# Patient Record
Sex: Male | Born: 1980 | Race: Black or African American | Hispanic: No | Marital: Single | Smoking: Never smoker
Health system: Southern US, Community
[De-identification: ages and names within clinical notes are randomized; demographics above are authoritative.]

---

## 2015-01-23 ENCOUNTER — Ambulatory Visit (INDEPENDENT_AMBULATORY_CARE_PROVIDER_SITE_OTHER): Payer: BLUE CROSS/BLUE SHIELD | Admitting: Family Medicine

## 2015-01-23 ENCOUNTER — Ambulatory Visit (INDEPENDENT_AMBULATORY_CARE_PROVIDER_SITE_OTHER): Payer: BLUE CROSS/BLUE SHIELD

## 2015-01-23 VITALS — BP 124/86 | HR 70 | Temp 98.5°F | Resp 14 | Ht 68.5 in | Wt 178.0 lb

## 2015-01-23 DIAGNOSIS — M79644 Pain in right finger(s): Secondary | ICD-10-CM | POA: Diagnosis not present

## 2015-01-23 DIAGNOSIS — M2550 Pain in unspecified joint: Secondary | ICD-10-CM

## 2015-01-23 LAB — URIC ACID: URIC ACID, SERUM: 7.8 mg/dL (ref 4.0–7.8)

## 2015-01-23 MED ORDER — NAPROXEN 500 MG PO TABS: 500.0000 mg | ORAL_TABLET | Freq: Two times a day (BID) | ORAL | Status: DC

## 2015-01-23 NOTE — Progress Notes (Addendum)
Subjective:  This chart was scribed for Alexander Staggers, MD by [facility], medical scribe at [facility].The patient was seen in exam room 01 and the patient's care was started at 4:25 PM.   Patient ID: Alexander Brooks, male    DOB: 1981/01/12, 34 y.o.   MRN: [id number] Chief Complaint  Patient presents with  . Hand Pain    right hand, 5th digit. x 1 year, describes pain as "shooting pain"   HPI HPI Comments: Alexander Brooks is a 34 y.o. male who presents to [facility] complaining of right a sharp pain at his fifth digit on his right hand. Pain has been constant, sharp and gradually worsening for roughly one year. The pain is mainly at night and deep breathing worsens the pain. No known injury and no swelling. Pt is left handed. 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 He has not taken any medication for this. Other joints have been sore lasting for only a few days then improving. He does not drink, but does eat seafood and red meat. Pt states he has noticed joints are more sore when he eats red meat.  Pt also complains of right hip pain onset one day ago, last week he had left hip pain but this has improved.   There are no active problems to display for this patient.  History reviewed. No pertinent past medical history. History reviewed. No pertinent past surgical history. Not on File Prior to Admission medications   Not on File   History   Social History  . Marital Status: Single    Spouse Name: N/A  . Number of Children: N/A  . Years of Education: N/A   Occupational History  . Not on file.   Social History Main Topics  . Smoking status: Never Smoker   . Smokeless tobacco: Not on file  . Alcohol Use: Not on file  . Drug Use: Not on file  . Sexual Activity: Not on file   Other Topics Concern  . Not on file   Social History Narrative  . No narrative on file   Review of Systems  Musculoskeletal: Positive  for arthralgias. Negative for joint swelling.      Objective:  BP 124/86 mmHg  Pulse 70  Temp(Src) 98.5 F (36.9 C) (Oral)  Resp 14  Ht 5' 8.5" (1.74 m)  Wt 178 lb (80.74 kg)  BMI 26.67 kg/m2  SpO2 96% Physical Exam  Constitutional: He is oriented to person, place, and time. He appears well-developed and well-nourished. No distress.  HENT:  Head: Normocephalic and atraumatic.  Eyes: Pupils are equal, round, and reactive to light.  Neck: Normal range of motion.  C-spine full ROM does not reproduce pain No Tinel's sign over right brachial plexus  Cardiovascular: Normal rate and regular rhythm.   Pulmonary/Chest: Effort normal. No respiratory distress.  Musculoskeletal: Normal range of motion.  Right shoulder and right elbow full range of motion and no reproduction of pain.  Negative Tinel's sign of the right elbow, specifically at the ulnar nerve. Right wrist full range of motion, no bony tenderness. Negative Tinel's sign at the guyon's canal. Negative Phalen's sign and Tinel's sign for carpal tunnel Right 5th finger full range of motion , skin  in tact no soft tissue or joint swelling Tender over distal phalanx just proximal to the nail of the 5th finger but he nail fold is without erythema or rash.  Neurological: He is alert and oriented to person, place, and time.  Skin: Skin is warm and dry.  Psychiatric: He has a normal mood and affect. His behavior is normal.  Nursing note and vitals reviewed.  [facility] reading (PRIMARY) by  Dr. Neva SeatGreene: R 5th finger: questionable nondisplaced fracture of base of distal 5th phalynx.      Assessment & Plan:   Sheilah PigeonClarence Fregeau is a 34 y.o. male Polyarthralgia - Plan: Uric Acid, DG Finger Little Right, naproxen (NAPROSYN) 500 MG tablet  Finger pain, right - Plan: Uric Acid, DG Finger Little Right, naproxen (NAPROSYN) 500 MG tablet   Presents primarily with right fifth finger pain without known injury. Questionable nondisplaced fracture on  x-ray. However based on his previous symptoms of polyarthralgia in various joints that resolve on their own, as well as association of these flares with diet indicates possible gout cause of his symptoms.   -Check uric acid level  -Split placed for fifth finger to just proximal to the DIP  -Naprosyn 500 mg twice a day when necessary with food, side effects discussed.   -Recheck in 2 weeks.  If polyarthralgias persist consider other rheumatologic testing   Meds ordered this encounter  Medications  . naproxen (NAPROSYN) 500 MG tablet    Sig: Take 1 tablet (500 mg total) by mouth 2 (two) times daily with a meal.    Dispense:  30 tablet    Refill:  0   Patient Instructions  There may be a small fracture at the end of your finger. Wear the splint and recheck with Dr. Neva SeatGreene in next 2 weeks (next weekend is ok).  Naproxen if needed twice per day with food (do not combine with other over the counter pain relievers).  You should receive a call or letter about your lab results (gout test) within the next week to 10 days. Return to the clinic or go to the nearest emergency room if any of your symptoms worsen or new symptoms occur.          I personally performed the services described in this documentation, which was scribed in my presence. The recorded information has been reviewed and considered, and addended by me as needed.

## 2015-01-23 NOTE — Patient Instructions (Signed)
There may be a small fracture at the end of your finger. Wear the splint and recheck with Dr. Neva SeatGreene in next 2 weeks (next weekend is ok).  Naproxen if needed twice per day with food (do not combine with other over the counter pain relievers).  You should receive a call or letter about your lab results (gout test) within the next week to 10 days. Return to the clinic or go to the nearest emergency room if any of your symptoms worsen or new symptoms occur.

## 2015-02-02 ENCOUNTER — Encounter: Payer: Self-pay | Admitting: Family Medicine

## 2015-02-12 ENCOUNTER — Ambulatory Visit (INDEPENDENT_AMBULATORY_CARE_PROVIDER_SITE_OTHER): Payer: BLUE CROSS/BLUE SHIELD | Admitting: Emergency Medicine

## 2015-02-12 VITALS — BP 120/80 | HR 81 | Temp 98.8°F | Resp 16 | Ht 69.0 in | Wt 179.0 lb

## 2015-02-12 DIAGNOSIS — M79644 Pain in right finger(s): Secondary | ICD-10-CM | POA: Diagnosis not present

## 2015-02-12 MED ORDER — METHYLPREDNISOLONE ACETATE 80 MG/ML IJ SUSP: 80.0000 mg | Freq: Once | INTRAMUSCULAR | Status: AC

## 2015-02-12 NOTE — Patient Instructions (Signed)

## 2015-02-12 NOTE — Progress Notes (Signed)
Subjective:  Patient ID: Alexander Brooks, male    DOB: 07-01-1981  Age: 34 y.o. MRN: [id number]  CC: Follow-up and Hand Pain   HPI Alexander Brooks presents  Pain of the terminal phalanx of his right fifth finger. He said that he was seen by Dr. Chilton Si and put on naproxen for DIP joint pain. He's had the pain for a protracted period of time has no history of injury. No swelling or redness or deformity of the joint. His lab work when Dr. Chilton Si performed it was normal.  History Alexander Brooks has no past medical history on file.   He has no past surgical history on file.   His  family history includes Diabetes in his father, mother, and sister.  He   reports that he has never smoked. He does not have any smokeless tobacco history on file. His alcohol and drug histories are not on file.  Outpatient Prescriptions Prior to Visit  Medication Sig Dispense Refill  . naproxen (NAPROSYN) 500 MG tablet Take 1 tablet (500 mg total) by mouth 2 (two) times daily with a meal. (Patient not taking: Reported on 02/12/2015) 30 tablet 0   No facility-administered medications prior to visit.    History   Social History  . Marital Status: Single    Spouse Name: N/A  . Number of Children: N/A  . Years of Education: N/A   Social History Main Topics  . Smoking status: Never Smoker   . Smokeless tobacco: Not on file  . Alcohol Use: Not on file  . Drug Use: Not on file  . Sexual Activity: Not on file   Other Topics Concern  . None   Social History Narrative     Review of Systems  Constitutional: Negative for fever, chills and appetite change.  HENT: Negative for congestion, ear pain, postnasal drip, sinus pressure and sore throat.   Eyes: Negative for pain and redness.  Respiratory: Negative for cough, shortness of breath and wheezing.   Cardiovascular: Negative for leg swelling.  Gastrointestinal: Negative for nausea, vomiting, abdominal pain, diarrhea, constipation and blood in stool.    Endocrine: Negative for polyuria.  Genitourinary: Negative for dysuria, urgency, frequency and flank pain.  Musculoskeletal: Positive for arthralgias. Negative for gait problem.  Skin: Negative for rash.  Neurological: Negative for weakness and headaches.  Psychiatric/Behavioral: Negative for confusion and decreased concentration. The patient is not nervous/anxious.     Objective:  BP 120/80 mmHg  Pulse 81  Temp(Src) 98.8 F (37.1 C) (Oral)  Resp 16  Ht 5\' 9"  (1.753 m)  Wt 179 lb (81.194 kg)  BMI 26.42 kg/m2  SpO2 98%  Physical Exam  Constitutional: He is oriented to person, place, and time. He appears well-developed and well-nourished.  HENT:  Head: Normocephalic and atraumatic.  Eyes: Conjunctivae are normal. Pupils are equal, round, and reactive to light.  Pulmonary/Chest: Effort normal.  Musculoskeletal: He exhibits no edema.  Neurological: He is alert and oriented to person, place, and time.  Skin: Skin is dry.  Psychiatric: He has a normal mood and affect. His behavior is normal. Thought content normal.      Assessment & Plan:   Alexander Brooks was seen today for follow-up and hand pain.  Diagnoses and all orders for this visit:  Finger pain, right   I am having Alexander Brooks maintain his naproxen.  No orders of the defined types were placed in this encounter.   He was instructed to continue taking his medication as prescribed  0.15  ml of Medrol was injected into the DIP joint  with relief of his pain. Appropriate red flag conditions were discussed with the patient as well as actions that should be taken.  Patient expressed his understanding.  Follow-up: Return if symptoms worsen or fail to improve.  Carmelina Dane, MD

## 2015-04-25 ENCOUNTER — Ambulatory Visit (INDEPENDENT_AMBULATORY_CARE_PROVIDER_SITE_OTHER): Payer: Self-pay | Admitting: Family Medicine

## 2015-04-25 VITALS — BP 116/71 | HR 90 | Temp 98.3°F | Resp 18 | Ht 68.0 in | Wt 178.4 lb

## 2015-04-25 DIAGNOSIS — Z0000 Encounter for general adult medical examination without abnormal findings: Secondary | ICD-10-CM

## 2015-04-25 NOTE — Progress Notes (Signed)
[facility] [address], [place] [address] [phone]  Date:  04/25/2015   Name:  Alexander Brooks   DOB:  November 01, 1980   MRN:  [id number]  PCP:  No primary care provider on file.    Chief Complaint: Annual Exam   History of Present Illness:  Alexander Brooks is a 34 y.o. 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   History reviewed. No pertinent past medical history.  History reviewed. No pertinent past surgical history.  Social History  Substance Use Topics  . Smoking status: Never Smoker   . Smokeless tobacco: None  . Alcohol Use: No    Family History  Problem Relation Age of Onset  . Diabetes Mother   . Diabetes Father   . Diabetes Sister     No Known Allergies  Medication list has been reviewed and updated.  No current outpatient prescriptions on file prior to visit.   Current Facility-Administered Medications on File Prior to Visit  Medication Dose Route Frequency Provider Last Rate Last Dose  . methylPREDNISolone acetate (DEPO-MEDROL) injection 80 mg  80 mg Intramuscular Once Carmelina Dane, MD        Review of Systems:  As per HPI- otherwise negative.   Physical Examination: Filed Vitals:   04/25/15 1446  BP: 116/71  Pulse: 90  Temp: 98.3 F (36.8 C)  Resp: 18   Filed Vitals:   04/25/15 1446  Height:  (1.727 m)  Weight: 178 lb 6.4 oz (80.922 kg)   Body mass index is 27.13 kg/(m^2). Ideal Body Weight: Weight in (lb) to have BMI = 25: 164.1  GEN: WDWN, NAD, Non-toxic, A & O x 3 HEENT: Atraumatic, Normocephalic. Neck supple. No masses, No LAD.  Bilateral TM wnl, oropharynx normal.  PEERL,EOMI.   Ears and Nose: No external deformity. CV: RRR, No M/G/R. No JVD. No thrill. No extra heart sounds. PULM: CTA B, no wheezes, crackles,  rhonchi. No retractions. No resp. distress. No accessory muscle use. ABD: S, NT, ND. No rebound. No HSM. EXTR: No c/c/e NEURO Normal gait.  PSYCH: Normally interactive. Conversant. Not depressed or anxious appearing.  Calm demeanor.  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

## 2017-03-11 IMAGING — CR DG FINGER LITTLE 2+V*R*
3 series · 3 of 3 positions shown · non-contrast
Comparison: None.

CLINICAL DATA: Right little finger pain for 1 year. No known
injury.

EXAM:
RIGHT LITTLE FINGER 2+V

[PA]
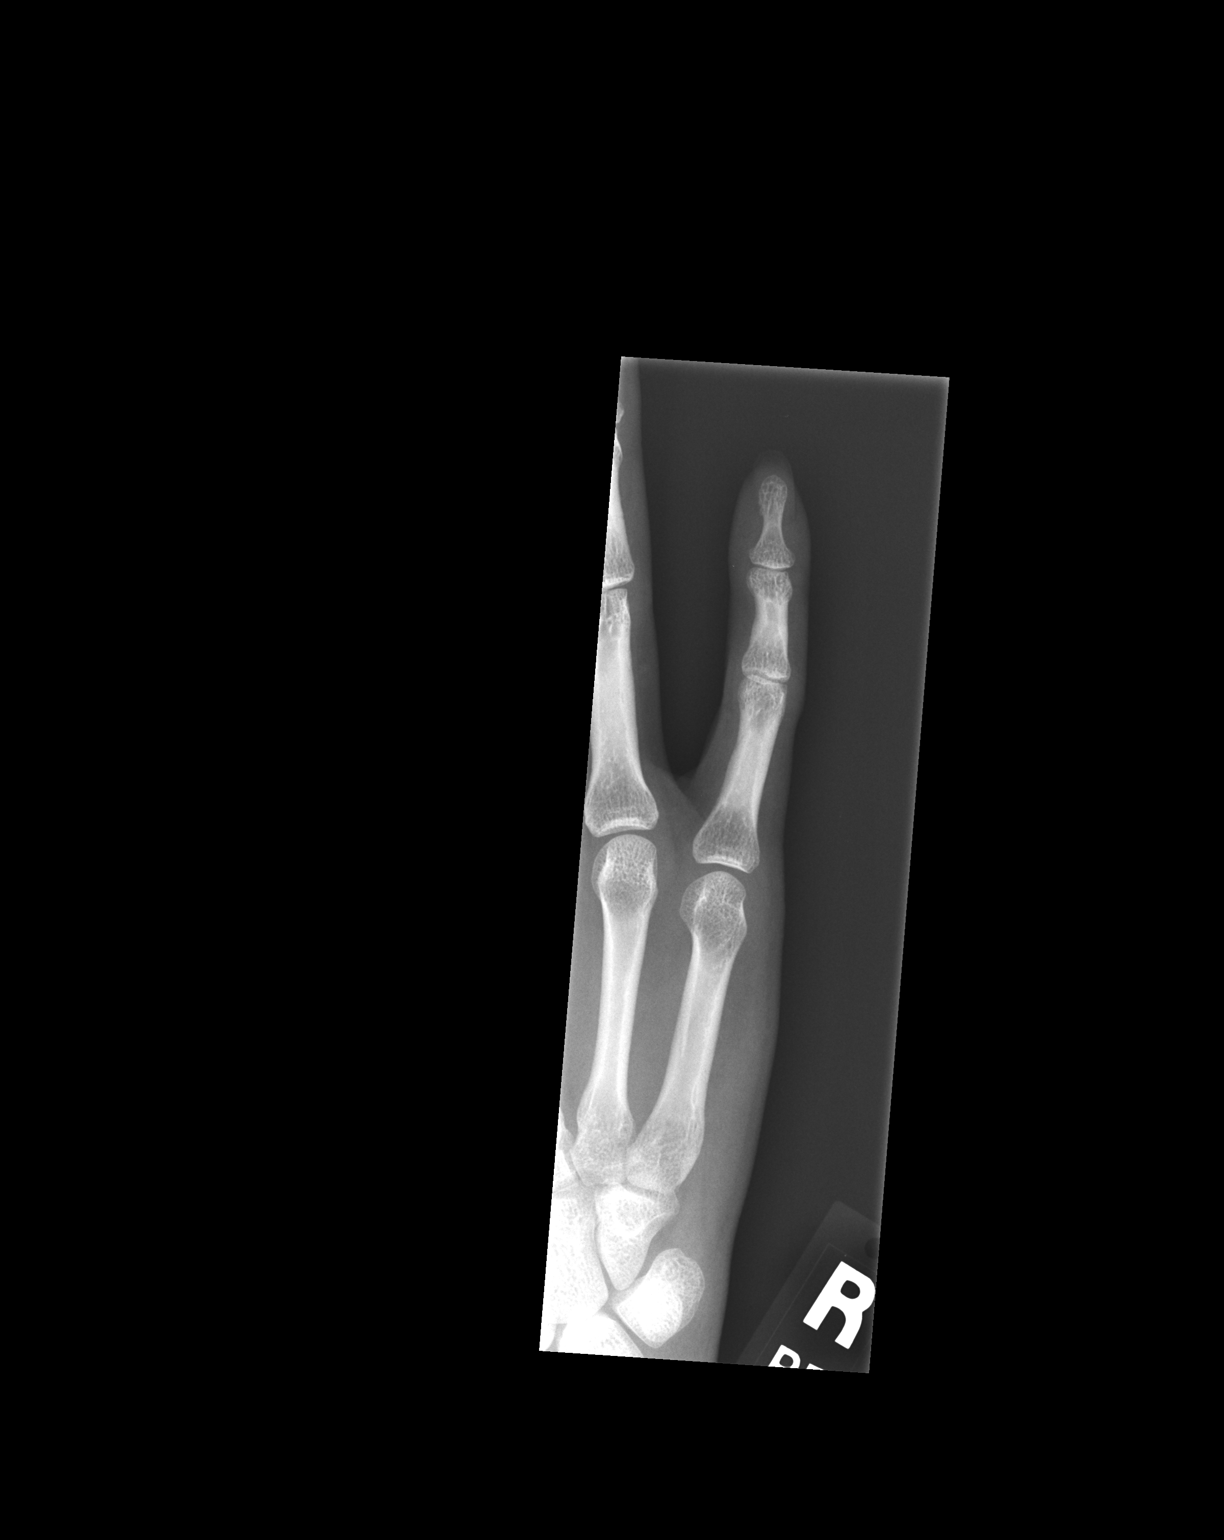

[pa obl]
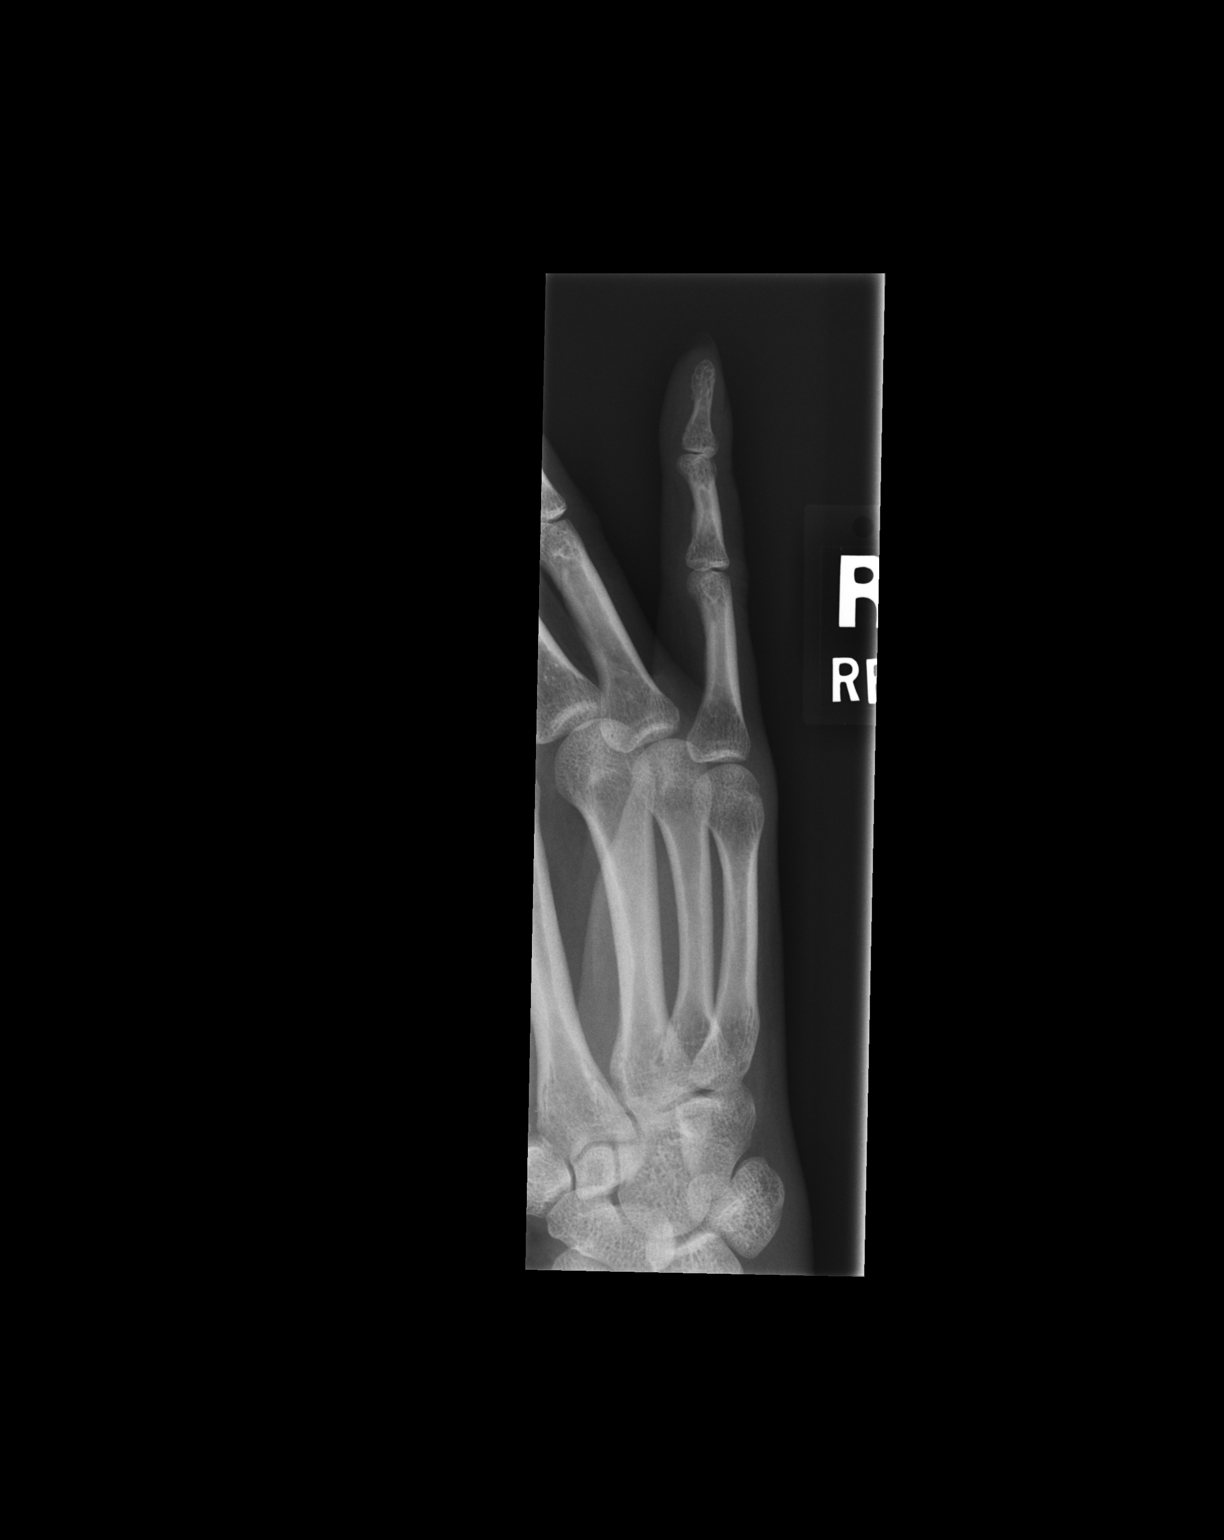

[lateral]
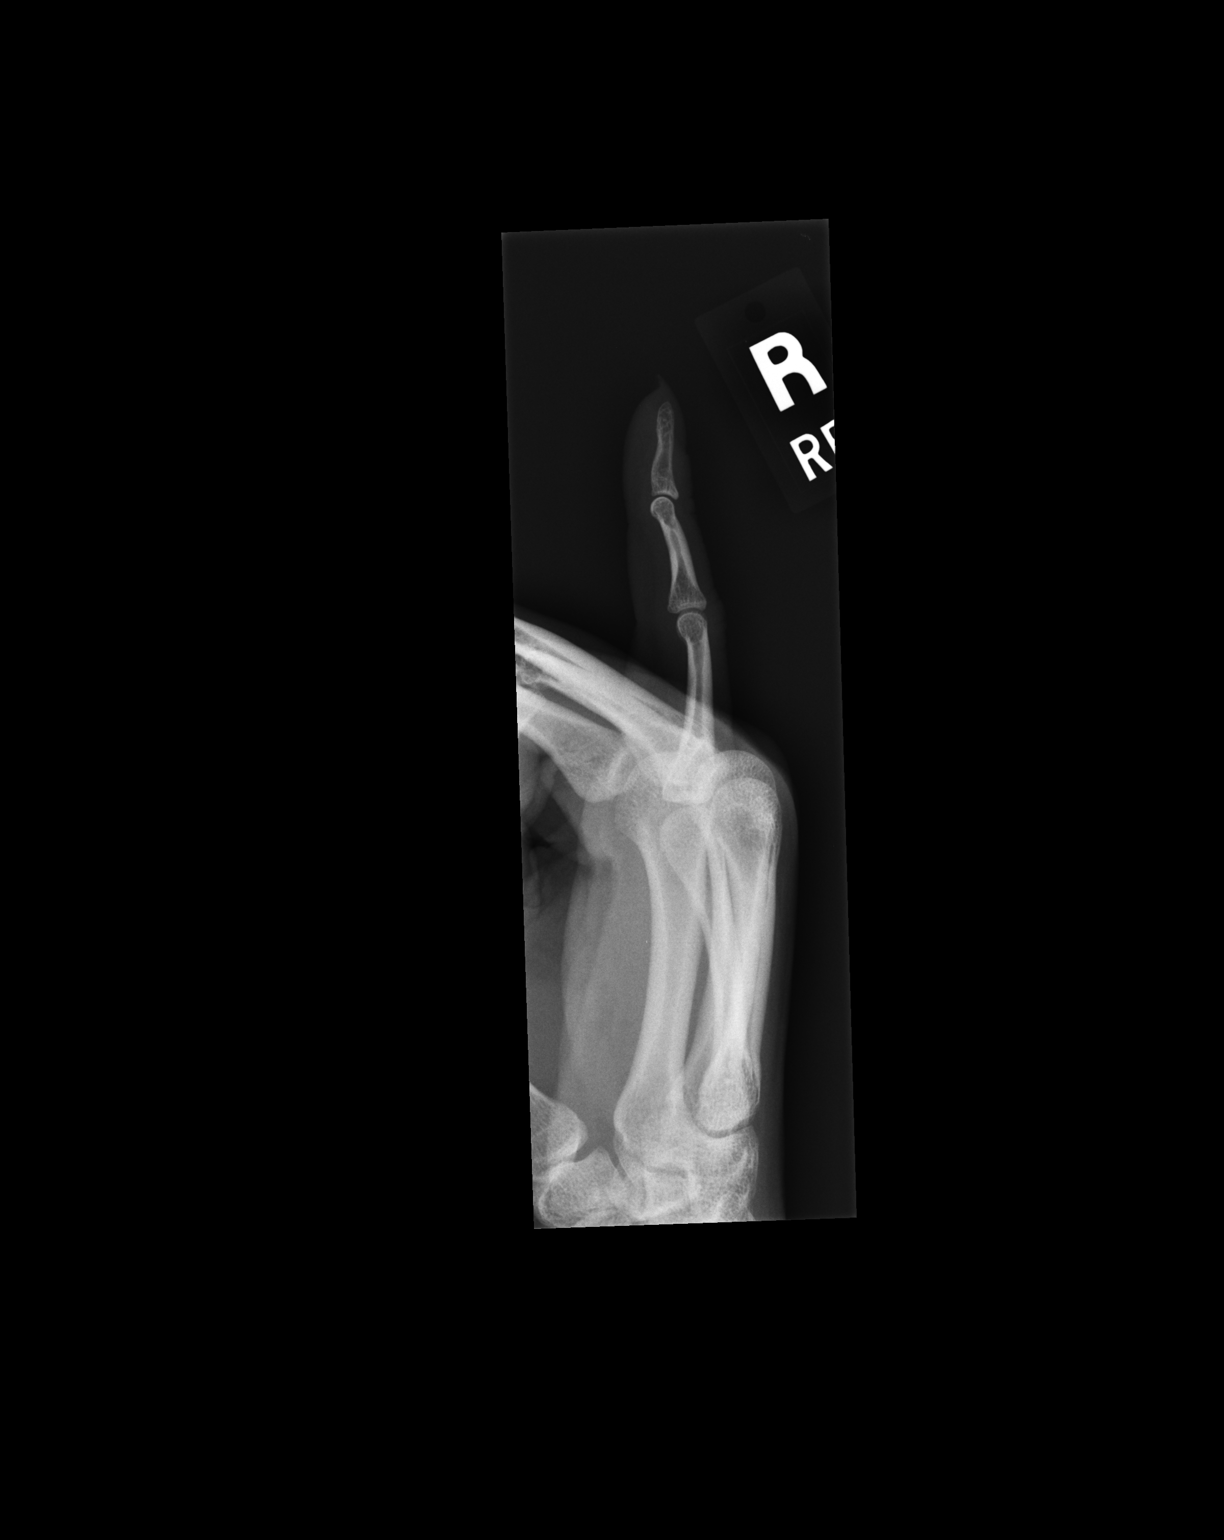

[3 of 3 positions shown; findings below may reference images not displayed]

FINDINGS: There is no evidence of fracture or dislocation. There is no
evidence of arthropathy or other focal bone abnormality. Soft
tissues are unremarkable.
IMPRESSION: Negative.

## 2022-08-24 ENCOUNTER — Other Ambulatory Visit: Payer: Self-pay

## 2022-08-24 ENCOUNTER — Encounter (HOSPITAL_BASED_OUTPATIENT_CLINIC_OR_DEPARTMENT_OTHER): Payer: Self-pay

## 2022-08-24 DIAGNOSIS — K529 Noninfective gastroenteritis and colitis, unspecified: Secondary | ICD-10-CM | POA: Diagnosis not present

## 2022-08-24 DIAGNOSIS — R112 Nausea with vomiting, unspecified: Secondary | ICD-10-CM | POA: Diagnosis present

## 2022-08-24 DIAGNOSIS — Z1152 Encounter for screening for COVID-19: Secondary | ICD-10-CM | POA: Insufficient documentation

## 2022-08-24 LAB — COMPREHENSIVE METABOLIC PANEL
ALT: 43 U/L (ref 0–44)
AST: 31 U/L (ref 15–41)
Albumin: 4.5 g/dL (ref 3.5–5.0)
Alkaline Phosphatase: 147 U/L — ABNORMAL HIGH (ref 38–126)
Anion gap: 14 (ref 5–15)
BUN: 9 mg/dL (ref 6–20)
CO2: 25 mmol/L (ref 22–32)
Calcium: 9.8 mg/dL (ref 8.9–10.3)
Chloride: 98 mmol/L (ref 98–111)
Creatinine, Ser: 1.15 mg/dL (ref 0.61–1.24)
GFR, Estimated: >60 mL/min (ref >60)
Glucose, Bld: 99 mg/dL (ref 70–99)
Potassium: 3.9 mmol/L (ref 3.5–5.1)
Sodium: 137 mmol/L (ref 135–145)
Total Bilirubin: 0.7 mg/dL (ref 0.3–1.2)
Total Protein: 8.9 g/dL — ABNORMAL HIGH (ref 6.5–8.1)

## 2022-08-24 LAB — URINALYSIS, ROUTINE W REFLEX MICROSCOPIC
Bacteria, UA: NONE SEEN
Bilirubin Urine: NEGATIVE
Glucose, UA: NEGATIVE mg/dL
Ketones, ur: NEGATIVE mg/dL
Leukocytes,Ua: NEGATIVE
Nitrite: NEGATIVE
Protein, ur: 30 mg/dL — AB
Specific Gravity, Urine: 1.02 (ref 1.005–1.030)
pH: 5.5 (ref 5.0–8.0)

## 2022-08-24 LAB — RESP PANEL BY RT-PCR (RSV, FLU A&B, COVID)  RVPGX2
Influenza A by PCR: NEGATIVE
Influenza B by PCR: NEGATIVE
Resp Syncytial Virus by PCR: NEGATIVE
SARS Coronavirus 2 by RT PCR: NEGATIVE

## 2022-08-24 LAB — CBC
HCT: 39.6 % (ref 39.0–52.0)
Hemoglobin: 13.6 g/dL (ref 13.0–17.0)
MCH: 28.8 pg (ref 26.0–34.0)
MCHC: 34.3 g/dL (ref 30.0–36.0)
MCV: 83.7 fL (ref 80.0–100.0)
Platelets: 318 10*3/uL (ref 150–400)
RBC: 4.73 MIL/uL (ref 4.22–5.81)
RDW: 12.6 % (ref 11.5–15.5)
WBC: 12.7 10*3/uL — ABNORMAL HIGH (ref 4.0–10.5)
nRBC: 0 % (ref 0.0–0.2)

## 2022-08-24 LAB — LIPASE, BLOOD: Lipase: 16 U/L (ref 11–51)

## 2022-08-24 NOTE — ED Triage Notes (Signed)
Patient here POV from Home.  Endorses N/V that began approximately 1 Week associated with ABD Cramping. No Diarrhea.   NAD Noted during Triage. A&Ox4. GCS 15. Ambulatory.

## 2022-08-25 ENCOUNTER — Emergency Department (HOSPITAL_BASED_OUTPATIENT_CLINIC_OR_DEPARTMENT_OTHER)
Admission: EM | Admit: 2022-08-25 | Discharge: 2022-08-25 | Disposition: A | Discharge status: Home / Self Care | Payer: Commercial Managed Care - PPO | Attending: Emergency Medicine | Admitting: Emergency Medicine

## 2022-08-25 DIAGNOSIS — K529 Noninfective gastroenteritis and colitis, unspecified: Secondary | ICD-10-CM

## 2022-08-25 MED ORDER — KETOROLAC TROMETHAMINE 30 MG/ML IJ SOLN
30.0000 mg | Freq: Once | INTRAMUSCULAR | Status: AC
  Administered 2022-08-25: 30 mg via INTRAVENOUS
  Filled 2022-08-25: qty 1

## 2022-08-25 MED ORDER — ONDANSETRON HCL 4 MG/2ML IJ SOLN
4.0000 mg | Freq: Once | INTRAMUSCULAR | Status: AC
  Administered 2022-08-25: 4 mg via INTRAVENOUS
  Filled 2022-08-25: qty 2

## 2022-08-25 MED ORDER — SODIUM CHLORIDE 0.9 % IV BOLUS
1000.0000 mL | Freq: Once | INTRAVENOUS | Status: AC
  Administered 2022-08-25: 1000 mL via INTRAVENOUS

## 2022-08-25 MED ORDER — ONDANSETRON 8 MG PO TBDP: ORAL_TABLET | ORAL | 0 refills | Status: AC

## 2022-08-25 NOTE — Discharge Instructions (Signed)
Begin taking Zofran as prescribed as needed for nausea.  Clear liquid diet for the next 12 hours, then slowly advance to normal as tolerated.  Return to the emergency department if you develop severe abdominal pain, bloody stools, or for other new and concerning symptoms.

## 2022-08-25 NOTE — ED Provider Notes (Signed)
[facility] Provider Note   CSN: [id number] Arrival date & time: 08/24/22  2028     History  Chief Complaint  Patient presents with   Emesis    Alexander Brooks is a 42 y.o. male.  Patient is a 42 year old male with no significant past medical history.  Patient presenting today with complaints of nausea, vomiting, and diarrhea since yesterday.  Symptoms have been worsening.  Stools are nonbloody.  He denies fevers or chills.  He denies ill contacts or having consumed undercooked or suspicious foods.  There are no aggravating or alleviating factors.  The history is provided by the patient.       Home Medications Prior to Admission medications   Not on File      Allergies    Patient has no known allergies.    Review of Systems   Review of Systems  All other systems reviewed and are negative.   Physical Exam Updated Vital Signs BP (!) 139/90   Pulse 92   Temp 99.8 F (37.7 C) (Oral)   Resp 17   Ht 5\' 8"  (1.727 m)   Wt 80.9 kg   SpO2 97%   BMI 27.12 kg/m  Physical Exam Vitals and nursing note reviewed.  Constitutional:      General: He is not in acute distress.    Appearance: He is well-developed. He is not diaphoretic.  HENT:     Head: Normocephalic and atraumatic.  Cardiovascular:     Rate and Rhythm: Normal rate and regular rhythm.     Heart sounds: No murmur heard.    No friction rub.  Pulmonary:     Effort: Pulmonary effort is normal. No respiratory distress.     Breath sounds: Normal breath sounds. No wheezing or rales.  Abdominal:     General: Bowel sounds are normal. There is no distension.     Palpations: Abdomen is soft.     Tenderness: There is no abdominal tenderness.  Musculoskeletal:        General: Normal range of motion.     Cervical back: Normal range of motion and neck supple.  Skin:    General: Skin is warm and dry.  Neurological:     Mental Status: He is alert and oriented to person, place, and time.      Coordination: Coordination normal.     ED Results / Procedures / Treatments   Labs (all labs ordered are listed, but only abnormal results are displayed) Labs Reviewed  COMPREHENSIVE METABOLIC PANEL - Abnormal; Notable for the following components:      Result Value   Total Protein 8.9 (*)    Alkaline Phosphatase 147 (*)    All other components within normal limits  CBC - Abnormal; Notable for the following components:   WBC 12.7 (*)    All other components within normal limits  URINALYSIS, ROUTINE W REFLEX MICROSCOPIC - Abnormal; Notable for the following components:   Hgb urine dipstick SMALL (*)    Protein, ur 30 (*)    All other components within normal limits  RESP PANEL BY RT-PCR (RSV, FLU A&B, COVID)  RVPGX2  LIPASE, BLOOD    EKG None  Radiology No results found.  Procedures Procedures    Medications Ordered in ED Medications  sodium chloride 0.9 % bolus 1,000 mL (has no administration in time range)  ondansetron (ZOFRAN) injection 4 mg (has no administration in time range)  ketorolac (TORADOL) 30 MG/ML injection 30 mg (has no administration  in time range)    ED Course/ Medical Decision Making/ A&P  Patient is a 42 year old male presenting with complaints of nausea, vomiting, and diarrhea for the past 24 hours.  He arrives here with stable vital signs and physical examination which is unremarkable.  I highly suspect either a viral or foodborne etiology.  Patient was given normal saline along with Toradol and Zofran for pain and nausea and seems to be feeling better.  I feel as though patient can safely be discharged with Zofran and as needed return.  Final Clinical Impression(s) / ED Diagnoses Final diagnoses:  None    Rx / DC Orders ED Discharge Orders     None         Veryl Speak, MD 08/25/22 [phone]

## 2022-09-07 ENCOUNTER — Encounter (HOSPITAL_BASED_OUTPATIENT_CLINIC_OR_DEPARTMENT_OTHER): Payer: Self-pay | Admitting: Emergency Medicine

## 2022-09-07 ENCOUNTER — Other Ambulatory Visit: Payer: Self-pay

## 2022-09-07 ENCOUNTER — Emergency Department (HOSPITAL_BASED_OUTPATIENT_CLINIC_OR_DEPARTMENT_OTHER)
Admission: EM | Admit: 2022-09-07 | Discharge: 2022-09-07 | Disposition: A | Discharge status: Home / Self Care | Payer: Commercial Managed Care - PPO | Attending: Emergency Medicine | Admitting: Emergency Medicine

## 2022-09-07 ENCOUNTER — Emergency Department (HOSPITAL_BASED_OUTPATIENT_CLINIC_OR_DEPARTMENT_OTHER): Payer: Commercial Managed Care - PPO

## 2022-09-07 ENCOUNTER — Emergency Department (HOSPITAL_BASED_OUTPATIENT_CLINIC_OR_DEPARTMENT_OTHER): Payer: Commercial Managed Care - PPO | Admitting: Radiology

## 2022-09-07 DIAGNOSIS — R112 Nausea with vomiting, unspecified: Secondary | ICD-10-CM | POA: Insufficient documentation

## 2022-09-07 DIAGNOSIS — R0602 Shortness of breath: Secondary | ICD-10-CM | POA: Diagnosis not present

## 2022-09-07 DIAGNOSIS — R000 Tachycardia, unspecified: Secondary | ICD-10-CM | POA: Insufficient documentation

## 2022-09-07 DIAGNOSIS — R109 Unspecified abdominal pain: Secondary | ICD-10-CM | POA: Insufficient documentation

## 2022-09-07 DIAGNOSIS — R197 Diarrhea, unspecified: Secondary | ICD-10-CM | POA: Diagnosis not present

## 2022-09-07 DIAGNOSIS — R21 Rash and other nonspecific skin eruption: Secondary | ICD-10-CM | POA: Insufficient documentation

## 2022-09-07 LAB — BASIC METABOLIC PANEL
Anion gap: 17 — ABNORMAL HIGH (ref 5–15)
BUN: 14 mg/dL (ref 6–20)
CO2: 25 mmol/L (ref 22–32)
Calcium: 9.8 mg/dL (ref 8.9–10.3)
Chloride: 93 mmol/L — ABNORMAL LOW (ref 98–111)
Creatinine, Ser: 1.01 mg/dL (ref 0.61–1.24)
GFR, Estimated: >60 mL/min (ref >60)
Glucose, Bld: 98 mg/dL (ref 70–99)
Potassium: 3.8 mmol/L (ref 3.5–5.1)
Sodium: 135 mmol/L (ref 135–145)

## 2022-09-07 LAB — BRAIN NATRIURETIC PEPTIDE: B Natriuretic Peptide: 13.1 pg/mL (ref 0.0–100.0)

## 2022-09-07 LAB — CBC
HCT: 37.7 % — ABNORMAL LOW (ref 39.0–52.0)
Hemoglobin: 13 g/dL (ref 13.0–17.0)
MCH: 28.2 pg (ref 26.0–34.0)
MCHC: 34.5 g/dL (ref 30.0–36.0)
MCV: 81.8 fL (ref 80.0–100.0)
Platelets: 394 10*3/uL (ref 150–400)
RBC: 4.61 MIL/uL (ref 4.22–5.81)
RDW: 12.3 % (ref 11.5–15.5)
WBC: 11.1 10*3/uL — ABNORMAL HIGH (ref 4.0–10.5)
nRBC: 0 % (ref 0.0–0.2)

## 2022-09-07 LAB — TROPONIN I (HIGH SENSITIVITY): Troponin I (High Sensitivity): 7 ng/L (ref <18)

## 2022-09-07 LAB — LIPASE, BLOOD: Lipase: 22 U/L (ref 11–51)

## 2022-09-07 LAB — D-DIMER, QUANTITATIVE: D-Dimer, Quant: 6.86 ug/mL-FEU — ABNORMAL HIGH (ref 0.00–0.50)

## 2022-09-07 LAB — TSH: TSH: 2.075 u[IU]/mL (ref 0.350–4.500)

## 2022-09-07 MED ORDER — LACTATED RINGERS IV BOLUS
1000.0000 mL | Freq: Once | INTRAVENOUS | Status: AC
  Administered 2022-09-07: 1000 mL via INTRAVENOUS

## 2022-09-07 MED ORDER — ONDANSETRON HCL 4 MG/2ML IJ SOLN
4.0000 mg | Freq: Once | INTRAMUSCULAR | Status: AC
  Administered 2022-09-07: 4 mg via INTRAVENOUS
  Filled 2022-09-07: qty 2

## 2022-09-07 MED ORDER — IOHEXOL 350 MG/ML SOLN
100.0000 mL | Freq: Once | INTRAVENOUS | Status: AC | PRN
  Administered 2022-09-07: 100 mL via INTRAVENOUS

## 2022-09-07 NOTE — ED Triage Notes (Signed)
Pt feels like he has sour stomach every morning since christmas.pt was seen here on the 1st, given antibiotics. He states a few days later he had a rash appear on his arms and legs.

## 2022-09-07 NOTE — Discharge Instructions (Addendum)
You were seen for your symptoms in the emergency department.  You had lab work and CT scans that were reassuring.  At home, please take the Zofran you are prescribed for your nausea.    Follow-up with your primary doctor in 2-3 days regarding your visit.  If you do not have a primary care doctor you may follow-up with [facility] which is listed in this packet.  Discussed the irregular prostate findings and need for PSA testing at that visit.  Please also call the GI doctors to schedule an appointment about your nausea vomiting and diarrhea.  Return immediately to the emergency department if you experience any of the following: Severe pain, difficulty breathing, or any other concerning symptoms.    Thank you for visiting our Emergency Department. It was a pleasure taking care of you today.

## 2022-09-07 NOTE — ED Provider Notes (Signed)
[facility] EMERGENCY DEPT Provider Note   CSN: [id number] Arrival date & time: 09/07/22  0754     History  Chief Complaint  Patient presents with   GI Problem   Shortness of Breath    Alexander Brooks is a 42 y.o. male.  42 year old male who presents to the emergency department with abdominal pain, nausea and vomiting.  Says he started experiencing symptoms around Christmas time.  Says that at that point in time he was having nausea vomiting and diarrhea.  Did come to the emergency department with reassuring evaluation and was diagnosed with gastroenteritis and sent home.  Has had persistent nausea and vomiting since then.  Says that it typically happens in the morning and evening when he is brushing his teeth.  Nonbloody nonbilious.  Says that he also will occasionally get vague generalized abdominal discomfort.  No diarrhea, melanotic stool, hematochezia, or mucousy stool.  Says that he has noticed a rash on his lower legs and left upper arm.  Denies any fevers.  No history of abdominal surgeries.  No headaches. Triage was noted to be tachycardic and reports that he occasionally becomes short of breath with palpitations since Christmas. Last several mins then resolves. No chest pain. No substance use including etoh or marijuana. No camping or tick exposure.        Home Medications Prior to Admission medications   Medication Sig Start Date End Date Taking? Authorizing Provider  ondansetron (ZOFRAN-ODT) 8 MG disintegrating tablet 8mg  ODT q4 hours prn nausea 08/25/22   10/24/22, MD      Allergies    Tape    Review of Systems   Review of Systems  Physical Exam Updated Vital Signs BP 117/78   Pulse (!) 104   Temp 98.1 F (36.7 C) (Oral)   Resp 18   SpO2 95%  Physical Exam Vitals and nursing note reviewed.  Constitutional:      General: He is not in acute distress.    Appearance: He is well-developed.  HENT:     Head: Normocephalic and atraumatic.      Right Ear: External ear normal.     Left Ear: External ear normal.     Nose: Nose normal.  Eyes:     Extraocular Movements: Extraocular movements intact.     Conjunctiva/sclera: Conjunctivae normal.     Pupils: Pupils are equal, round, and reactive to light.  Cardiovascular:     Rate and Rhythm: Regular rhythm. Tachycardia present.     Heart sounds: Normal heart sounds.  Pulmonary:     Effort: Pulmonary effort is normal. No respiratory distress.     Breath sounds: Normal breath sounds.  Abdominal:     General: There is no distension.     Palpations: Abdomen is soft. There is no mass.     Tenderness: There is no abdominal tenderness. There is no guarding.  Musculoskeletal:     Cervical back: Normal range of motion and neck supple.     Right lower leg: No edema.     Left lower leg: No edema.  Skin:    General: Skin is warm and dry.     Findings: Rash present.  Neurological:     Mental Status: He is alert. Mental status is at baseline.  Psychiatric:        Mood and Affect: Mood normal.        Behavior: Behavior normal.     L forearm         ED  Results / Procedures / Treatments   Labs (all labs ordered are listed, but only abnormal results are displayed) Labs Reviewed  BASIC METABOLIC PANEL - Abnormal; Notable for the following components:      Result Value   Chloride 93 (*)    Anion gap 17 (*)    All other components within normal limits  CBC - Abnormal; Notable for the following components:   WBC 11.1 (*)    HCT 37.7 (*)    All other components within normal limits  D-DIMER, QUANTITATIVE - Abnormal; Notable for the following components:   D-Dimer, Quant 6.86 (*)    All other components within normal limits  BRAIN NATRIURETIC PEPTIDE  TSH  LIPASE, BLOOD  TROPONIN I (HIGH SENSITIVITY)    EKG EKG Interpretation  Date/Time:  Sunday September 07 2022 08:08:53 EST Ventricular Rate:  131 PR Interval:  146 QRS Duration: 72 QT Interval:  278 QTC  Calculation: 410 R Axis:   40 Text Interpretation: Sinus tachycardia Otherwise normal ECG No previous ECGs available Confirmed by Margaretmary Eddy [phone]) on 09/07/2022 8:24:20 AM  Radiology CT Angio Chest PE W and/or Wo Contrast  Result Date: 09/07/2022 CLINICAL DATA:  Tachycardia.  Shortness of breath. EXAM: CT ANGIOGRAPHY CHEST CT ABDOMEN AND PELVIS WITH CONTRAST TECHNIQUE: Multidetector CT imaging of the chest was performed using the standard protocol during bolus administration of intravenous contrast. Multiplanar CT image reconstructions and MIPs were obtained to evaluate the vascular anatomy. Multidetector CT imaging of the abdomen and pelvis was performed using the standard protocol during bolus administration of intravenous contrast. RADIATION DOSE REDUCTION: This exam was performed according to the departmental dose-optimization program which includes automated exposure control, adjustment of the mA and/or kV according to patient size and/or use of iterative reconstruction technique. CONTRAST:  165mL OMNIPAQUE IOHEXOL 350 MG/ML SOLN COMPARISON:  Two-view chest x-ray 09/07/2022. FINDINGS: CTA CHEST FINDINGS Cardiovascular: Heart size is normal. Aorta and great vessel origins are within normal limits. Pulmonary artery opacification is satisfactory. No focal filling defects are present to suggest pulmonary embolus. Pulmonary artery size is normal. Mediastinum/Nodes: No enlarged mediastinal, hilar, or axillary lymph nodes. Thyroid gland, trachea, and esophagus demonstrate no significant findings. Lungs/Pleura: Linear atelectasis is present at the left base. The lungs are otherwise clear. No significant effusion or pneumothorax is present. Musculoskeletal: No chest wall abnormality. No acute or significant osseous findings. Review of the MIP images confirms the above findings. CT ABDOMEN and PELVIS FINDINGS Hepatobiliary: No focal liver abnormality is seen. No gallstones, gallbladder wall thickening, or  biliary dilatation. Pancreas: Unremarkable. No pancreatic ductal dilatation or surrounding inflammatory changes. Spleen: Normal in size without focal abnormality. Adrenals/Urinary Tract: Adrenal glands are normal bilaterally. Kidneys are within normal limits. No stone or mass lesion is present. Ureters are mildly dominant with some enhancement of the urothelium. No significant stranding is present about the ureters in this may be within normal limits. The urinary bladder is normal. Stomach/Bowel: The stomach and duodenum are within normal limits. Small bowel is unremarkable. Terminal ileum is normal. The appendix is visualized and within normal limits. The ascending and transverse colon are within normal limits. The descending and sigmoid colon are unremarkable. Vascular/Lymphatic: No significant vascular findings are present. No enlarged abdominal or pelvic lymph nodes. Reproductive: The prostate gland is of normal size although there is some heterogeneous enhancement. Mild prominence seminal vesicles noted as well without inflammatory change. This may be within normal limits. Other: No abdominal wall hernia or abnormality. No abdominopelvic ascites. Musculoskeletal: Vertebral  body heights and alignment are normal. No focal osseous lesions are present through the axial skeleton. Review of the MIP images confirms the above findings. IMPRESSION: 1. No pulmonary embolus. 2. Linear atelectasis at the left base. 3. Mild prominence of the ureters with some enhancement of the urothelium. No significant stranding is present about the ureters in this may be within normal limits. Please correlate with urinalysis to exclude urinary tract infection. 4. Heterogeneous enhancement of the prostate gland and seminal vesicles. This may be within normal limits. Please correlate with PSA. 5. No other acute or focal lesion to explain the patient's symptoms. Electronically Signed   By: San Morelle M.D.   On: 09/07/2022 10:46    CT ABDOMEN PELVIS W CONTRAST  Result Date: 09/07/2022 CLINICAL DATA:  Tachycardia.  Shortness of breath. EXAM: CT ANGIOGRAPHY CHEST CT ABDOMEN AND PELVIS WITH CONTRAST TECHNIQUE: Multidetector CT imaging of the chest was performed using the standard protocol during bolus administration of intravenous contrast. Multiplanar CT image reconstructions and MIPs were obtained to evaluate the vascular anatomy. Multidetector CT imaging of the abdomen and pelvis was performed using the standard protocol during bolus administration of intravenous contrast. RADIATION DOSE REDUCTION: This exam was performed according to the departmental dose-optimization program which includes automated exposure control, adjustment of the mA and/or kV according to patient size and/or use of iterative reconstruction technique. CONTRAST:  170mL OMNIPAQUE IOHEXOL 350 MG/ML SOLN COMPARISON:  Two-view chest x-ray 09/07/2022. FINDINGS: CTA CHEST FINDINGS Cardiovascular: Heart size is normal. Aorta and great vessel origins are within normal limits. Pulmonary artery opacification is satisfactory. No focal filling defects are present to suggest pulmonary embolus. Pulmonary artery size is normal. Mediastinum/Nodes: No enlarged mediastinal, hilar, or axillary lymph nodes. Thyroid gland, trachea, and esophagus demonstrate no significant findings. Lungs/Pleura: Linear atelectasis is present at the left base. The lungs are otherwise clear. No significant effusion or pneumothorax is present. Musculoskeletal: No chest wall abnormality. No acute or significant osseous findings. Review of the MIP images confirms the above findings. CT ABDOMEN and PELVIS FINDINGS Hepatobiliary: No focal liver abnormality is seen. No gallstones, gallbladder wall thickening, or biliary dilatation. Pancreas: Unremarkable. No pancreatic ductal dilatation or surrounding inflammatory changes. Spleen: Normal in size without focal abnormality. Adrenals/Urinary Tract: Adrenal glands  are normal bilaterally. Kidneys are within normal limits. No stone or mass lesion is present. Ureters are mildly dominant with some enhancement of the urothelium. No significant stranding is present about the ureters in this may be within normal limits. The urinary bladder is normal. Stomach/Bowel: The stomach and duodenum are within normal limits. Small bowel is unremarkable. Terminal ileum is normal. The appendix is visualized and within normal limits. The ascending and transverse colon are within normal limits. The descending and sigmoid colon are unremarkable. Vascular/Lymphatic: No significant vascular findings are present. No enlarged abdominal or pelvic lymph nodes. Reproductive: The prostate gland is of normal size although there is some heterogeneous enhancement. Mild prominence seminal vesicles noted as well without inflammatory change. This may be within normal limits. Other: No abdominal wall hernia or abnormality. No abdominopelvic ascites. Musculoskeletal: Vertebral body heights and alignment are normal. No focal osseous lesions are present through the axial skeleton. Review of the MIP images confirms the above findings. IMPRESSION: 1. No pulmonary embolus. 2. Linear atelectasis at the left base. 3. Mild prominence of the ureters with some enhancement of the urothelium. No significant stranding is present about the ureters in this may be within normal limits. Please correlate with urinalysis to exclude  urinary tract infection. 4. Heterogeneous enhancement of the prostate gland and seminal vesicles. This may be within normal limits. Please correlate with PSA. 5. No other acute or focal lesion to explain the patient's symptoms. Electronically Signed   By: Marin Roberts M.D.   On: 09/07/2022 10:46   DG Chest 2 View  Result Date: 09/07/2022 CLINICAL DATA:  Shortness of breath. EXAM: CHEST - 2 VIEW COMPARISON:  None Available. FINDINGS: Heart size and mediastinal contours are within normal  limits. Lungs are clear. No pleural effusion or pneumothorax is seen. Osseous structures about the chest are unremarkable. IMPRESSION: No active cardiopulmonary disease. No evidence of pneumonia or pulmonary edema. Electronically Signed   By: Bary Richard M.D.   On: 09/07/2022 09:39    Procedures Procedures   Medications Ordered in ED Medications  lactated ringers bolus 1,000 mL (1,000 mLs Intravenous New Bag/Given 09/07/22 0934)  ondansetron (ZOFRAN) injection 4 mg (4 mg Intravenous Given 09/07/22 0936)  iohexol (OMNIPAQUE) 350 MG/ML injection 100 mL (100 mLs Intravenous Contrast Given 09/07/22 1032)    ED Course/ Medical Decision Making/ A&P Clinical Course as of 09/07/22 1812  Wynelle Link Sep 07, 2022  1138 Discussed incidental findings on the CT with the patient.  He denies any symptoms of UTI at this time.  Instructed to follow-up with his primary doctor regarding his irregular prostate. [RP]    Clinical Course User Index [RP] Rondel Baton, MD                            Medical Decision Making Amount and/or Complexity of Data Reviewed Labs: ordered. Radiology: ordered.  Risk Prescription drug management.   Tallan Sandoz is a 42 y.o. male previously healthy who presents to the emergency department with multiple complaints including abdominal pain, nausea, vomiting, shortness of breath and palpitations  Initial Ddx:  Gastroenteritis, irritable bowel syndrome, inflammatory bowel disease, colitis, hypothyroidism, electrolyte abnormality, arrhythmia, PE  MDM:  Unclear what is causing the patient's symptoms at this time.  He is overall well-appearing.  Since this is a second visit to the emergency department with persistent symptoms will initiate a broader workup for him.  Will check lab work to assess for electrolyte abnormality as well as check his thyroid functions that could be related to his palpitations, diarrhea, and rash.  Also send a D-dimer to evaluate for PE with his  shortness of breath and tachycardia.  Will also obtain CT of the abdomen pelvis to evaluate for possible colitis or signs of inflammatory bowel disease.  Unclear what is causing patient's rash but does not appear to be involving the palms or soles or mucous membranes at this time.  Low concern for dangerous etiology of his rash.  Plan:  Labs TSH D-dimer EKG Chest x-ray IV fluids Zofran  ED Summary/Re-evaluation:  Patient underwent the above workup and was stable.  Did have elevated D-dimer so CTA of the chest was obtained along with the abdomen pelvis.  Did not show any acute findings but did show prostate irregularity that the patient was notified of instructed to follow-up with his primary doctor about.  Did also have ureter irregularity but patient states that he is not having any urinary symptoms at this time so feel that UTI is highly unlikely.  Patient able to tolerate p.o. in the emergency department.  Patient instructed to follow-up with his primary doctor in 2 to 3 days.  Also instructed to follow-up with GI.  Offered short course of Zofran the patient states that he had some left from his last visit.  This patient presents to the ED for concern of complaints listed in HPI, this involves an extensive number of treatment options, and is a complaint that carries with it a high risk of complications and morbidity. Disposition including potential need for admission considered.   Dispo: DC Home. Return precautions discussed including, but not limited to, those listed in the AVS. Allowed pt time to ask questions which were answered fully prior to dc.  Records reviewed ED Visit Notes The following labs were independently interpreted: D-dimer and show  elevated concerning for possible PE I independently reviewed the following imaging with scope of interpretation limited to determining acute life threatening conditions related to emergency care: Chest x-ray and agree with the radiologist  interpretation with the following exceptions: None I personally reviewed and interpreted cardiac monitoring: normal sinus rhythm  and sinus tachycardia I personally reviewed and interpreted the pt's EKG: see above for interpretation  I have reviewed the patients home medications and made adjustments as needed  Final Clinical Impression(s) / ED Diagnoses Final diagnoses:  Nausea vomiting and diarrhea  Abdominal pain, unspecified abdominal location  Tachycardia  Shortness of breath    Rx / DC Orders ED Discharge Orders     None         Rondel Baton, MD 09/07/22 1812

## 2022-09-07 NOTE — ED Notes (Signed)
He is tolerating eating a snack and drinking just fine.

## 2022-09-07 NOTE — ED Triage Notes (Signed)
Pt has heart rate of 120 in triage, he states after questioning that he feels like his heart is racing at times and he has to sit down because he becomes short of breath, states that happened since christmas as well.
